# Patient Record
Sex: Female | Born: 1980
Health system: Southern US, Community
[De-identification: ages and names within clinical notes are randomized; demographics above are authoritative.]

---

## 2019-03-15 DIAGNOSIS — Z0000 Encounter for general adult medical examination without abnormal findings: Secondary | ICD-10-CM | POA: Diagnosis not present

## 2019-03-28 DIAGNOSIS — Z3169 Encounter for other general counseling and advice on procreation: Secondary | ICD-10-CM | POA: Diagnosis not present

## 2019-03-28 DIAGNOSIS — Z01419 Encounter for gynecological examination (general) (routine) without abnormal findings: Secondary | ICD-10-CM | POA: Diagnosis not present

## 2019-03-28 DIAGNOSIS — Z1151 Encounter for screening for human papillomavirus (HPV): Secondary | ICD-10-CM | POA: Diagnosis not present

## 2019-03-28 DIAGNOSIS — Z6833 Body mass index (BMI) 33.0-33.9, adult: Secondary | ICD-10-CM | POA: Diagnosis not present

## 2019-04-30 DIAGNOSIS — N970 Female infertility associated with anovulation: Secondary | ICD-10-CM | POA: Diagnosis not present

## 2019-06-12 ENCOUNTER — Other Ambulatory Visit: Payer: Self-pay

## 2019-06-12 DIAGNOSIS — Z20822 Contact with and (suspected) exposure to covid-19: Secondary | ICD-10-CM

## 2019-06-12 NOTE — Addendum Note (Signed)
Addended by: Isidoro Donning T on: 06/12/2019 02:48 PM   Modules accepted: Orders

## 2019-06-13 LAB — NOVEL CORONAVIRUS, NAA: SARS-CoV-2, NAA: NOT DETECTED

## 2019-08-07 DIAGNOSIS — N939 Abnormal uterine and vaginal bleeding, unspecified: Secondary | ICD-10-CM | POA: Diagnosis not present

## 2019-10-25 ENCOUNTER — Ambulatory Visit: Payer: BC Managed Care – PPO | Admitting: Podiatry

## 2019-10-25 ENCOUNTER — Other Ambulatory Visit: Payer: Self-pay

## 2019-10-25 DIAGNOSIS — B070 Plantar wart: Secondary | ICD-10-CM

## 2019-10-25 MED ORDER — HYDROCODONE-ACETAMINOPHEN 5-325 MG PO TABS: 1.0000 | ORAL_TABLET | Freq: Four times a day (QID) | ORAL | 0 refills | Status: DC | PRN

## 2019-10-27 NOTE — Progress Notes (Signed)
   Subjective: 39 y.o. female presenting today as a new patient with a chief complaint of throbbing pain to the plantar midfoot that began about two years ago. She is concerned for a plantar wart and states it has been gradually worsening. Walking increases the pain. She has treated the area with OTC and prescription medications. Patient is here for further evaluation and treatment.    No past medical history on file.  Objective: Physical Exam General: The patient is alert and oriented x3 in no acute distress.   Dermatology: Hyperkeratotic skin lesion(s) noted to the plantar aspect of the left foot approximately 1 cm in diameter. Pinpoint bleeding noted upon debridement. Skin is warm, dry and supple bilateral lower extremities. Negative for open lesions or macerations.   Vascular: Palpable pedal pulses bilaterally. No edema or erythema noted. Capillary refill within normal limits.   Neurological: Epicritic and protective threshold grossly intact bilaterally.    Musculoskeletal Exam: Pain on palpation to the noted skin lesion(s).  Range of motion within normal limits to all pedal and ankle joints bilateral. Muscle strength 5/5 in all groups bilateral.    Assessment: #1 plantar wart left foot   Plan of Care:  #1 Patient was evaluated. #2 Excisional debridement of the plantar wart lesion(s) was performed using a chisel blade. Cantharone was applied and the lesion(s) was dressed with a dry sterile dressing. #3 Prescription for Vicodin 5/325 mg provided to patient.  #4 patient is to return to clinic in 2 weeks.   Investment banker, corporate.   Felecia Shelling, DPM [facility]  Dr. Felecia Shelling, DPM    [address]                                        [place], [place] [address]                Office [phone]  Fax [phone]

## 2019-11-08 ENCOUNTER — Ambulatory Visit: Payer: BC Managed Care – PPO | Admitting: Podiatry

## 2019-11-08 ENCOUNTER — Other Ambulatory Visit: Payer: Self-pay

## 2019-11-08 VITALS — Temp 98.0°F

## 2019-11-08 DIAGNOSIS — B070 Plantar wart: Secondary | ICD-10-CM

## 2019-11-08 MED ORDER — CIMETIDINE 400 MG PO TABS: 400.0000 mg | ORAL_TABLET | Freq: Two times a day (BID) | ORAL | 1 refills | Status: DC

## 2019-11-12 NOTE — Progress Notes (Signed)
   Subjective: 39 y.o. female presenting today for follow up evaluation of a plantar wart of the left foot. She states the area looks worse but feels somewhat better. She has been taking Vicodin for pain. Bearing weight and walking excessively increases the symptoms. Patient is here for further evaluation and treatment.   No past medical history on file.  Objective: Physical Exam General: The patient is alert and oriented x3 in no acute distress.   Dermatology: Hyperkeratotic skin lesion(s) noted to the plantar aspect of the left foot approximately 1 cm in diameter. Pinpoint bleeding noted upon debridement. Skin is warm, dry and supple bilateral lower extremities. Negative for open lesions or macerations.   Vascular: Palpable pedal pulses bilaterally. No edema or erythema noted. Capillary refill within normal limits.   Neurological: Epicritic and protective threshold grossly intact bilaterally.    Musculoskeletal Exam: Pain on palpation to the noted skin lesion(s).  Range of motion within normal limits to all pedal and ankle joints bilateral. Muscle strength 5/5 in all groups bilateral.    Assessment: #1 plantar wart left foot   Plan of Care:  #1 Patient was evaluated. #2 Excisional debridement of the plantar wart lesion(s) was performed using a chisel blade. Salinocaine was applied and the lesion(s) was dressed with a dry sterile dressing. #3 Prescription for Cimetidine 400 mg BID #60 provided to patient.  #4 Recommended OTC wart remover daily.  #5 patient is to return to clinic in 4 weeks.   Investment banker, corporate.   Felecia Shelling, DPM [facility]  Dr. Felecia Shelling, DPM    [address]                                        [place], [place] [address]                Office [phone]  Fax [phone]

## 2019-12-10 ENCOUNTER — Ambulatory Visit: Payer: BC Managed Care – PPO | Admitting: Podiatry

## 2019-12-10 ENCOUNTER — Other Ambulatory Visit: Payer: Self-pay

## 2019-12-10 DIAGNOSIS — B070 Plantar wart: Secondary | ICD-10-CM | POA: Diagnosis not present

## 2019-12-10 MED ORDER — IMIQUIMOD 5 % EX CREA: TOPICAL_CREAM | Freq: Every day | CUTANEOUS | 3 refills | Status: DC

## 2019-12-10 NOTE — Progress Notes (Signed)
   Subjective: 39 y.o. female presenting today for follow up evaluation of a plantar wart of the left foot. Patients states there is no improvement.  She continues to experience pain and tenderness when walking.  She has been applying   the salicylic acid daily.  She presents today for follow-up treatment evaluation  No past medical history on file.  Objective: Physical Exam General: The patient is alert and oriented x3 in no acute distress.   Dermatology: Hyperkeratotic skin lesion(s) noted to the plantar aspect of the left foot, multiple. Pinpoint bleeding noted upon debridement. Skin is warm, dry and supple bilateral lower extremities. Negative for open lesions or macerations.   Vascular: Palpable pedal pulses bilaterally. No edema or erythema noted. Capillary refill within normal limits.   Neurological: Epicritic and protective threshold grossly intact bilaterally.    Musculoskeletal Exam: Pain on palpation to the noted skin lesion(s).  Range of motion within normal limits to all pedal and ankle joints bilateral. Muscle strength 5/5 in all groups bilateral.    Assessment: #1 plantar wart left foot   Plan of Care:  #1 Patient was evaluated. #2 Excisional debridement of the plantar wart lesion(s) was performed using a chisel blade. Salinocaine was applied and the lesion(s) was dressed with a dry sterile dressing. #3  Discontinue cimetidine 400 mg twice daily #4 imiquimod 5% topical cream prescribed to apply nightly at bedtime under occlusion with a 40% salicylic acid pad #5 return to clinic in 6 weeks  Investment banker, corporate.   Felecia Shelling, DPM [facility]  Dr. Felecia Shelling, DPM    [address]                                        [place], [place] [address]                Office [phone]  Fax [phone]

## 2020-01-10 ENCOUNTER — Ambulatory Visit: Payer: BC Managed Care – PPO | Admitting: Podiatry

## 2020-01-31 ENCOUNTER — Ambulatory Visit: Payer: BC Managed Care – PPO | Admitting: Podiatry

## 2020-03-28 ENCOUNTER — Encounter: Payer: Self-pay | Admitting: Emergency Medicine

## 2020-03-28 ENCOUNTER — Emergency Department
Admission: EM | Admit: 2020-03-28 | Discharge: 2020-03-28 | Disposition: A | Discharge status: Home / Self Care | Payer: BC Managed Care – PPO | Attending: Student in an Organized Health Care Education/Training Program | Admitting: Student in an Organized Health Care Education/Training Program

## 2020-03-28 ENCOUNTER — Other Ambulatory Visit: Payer: Self-pay

## 2020-03-28 ENCOUNTER — Emergency Department: Payer: BC Managed Care – PPO

## 2020-03-28 ENCOUNTER — Telehealth: Payer: Self-pay | Admitting: Emergency Medicine

## 2020-03-28 DIAGNOSIS — J189 Pneumonia, unspecified organism: Secondary | ICD-10-CM | POA: Diagnosis not present

## 2020-03-28 DIAGNOSIS — U071 COVID-19: Secondary | ICD-10-CM | POA: Diagnosis not present

## 2020-03-28 DIAGNOSIS — Z79899 Other long term (current) drug therapy: Secondary | ICD-10-CM | POA: Insufficient documentation

## 2020-03-28 DIAGNOSIS — R05 Cough: Secondary | ICD-10-CM | POA: Diagnosis not present

## 2020-03-28 DIAGNOSIS — J1282 Pneumonia due to coronavirus disease 2019: Secondary | ICD-10-CM | POA: Insufficient documentation

## 2020-03-28 DIAGNOSIS — R509 Fever, unspecified: Secondary | ICD-10-CM | POA: Diagnosis not present

## 2020-03-28 LAB — URINALYSIS, COMPLETE (UACMP) WITH MICROSCOPIC
Bilirubin Urine: NEGATIVE
Glucose, UA: NEGATIVE mg/dL
Hgb urine dipstick: NEGATIVE
Ketones, ur: 80 mg/dL — AB
Leukocytes,Ua: NEGATIVE
Nitrite: NEGATIVE
Protein, ur: 30 mg/dL — AB
Specific Gravity, Urine: 1.025 (ref 1.005–1.030)
pH: 5 (ref 5.0–8.0)

## 2020-03-28 LAB — COMPREHENSIVE METABOLIC PANEL
ALT: 49 U/L — ABNORMAL HIGH (ref 0–44)
AST: 40 U/L (ref 15–41)
Albumin: 3.6 g/dL (ref 3.5–5.0)
Alkaline Phosphatase: 59 U/L (ref 38–126)
Anion gap: 11 (ref 5–15)
BUN: 6 mg/dL (ref 6–20)
CO2: 22 mmol/L (ref 22–32)
Calcium: 8.4 mg/dL — ABNORMAL LOW (ref 8.9–10.3)
Chloride: 99 mmol/L (ref 98–111)
Creatinine, Ser: 0.68 mg/dL (ref 0.44–1.00)
GFR calc Af Amer: >60 mL/min (ref >60)
GFR calc non Af Amer: >60 mL/min (ref >60)
Glucose, Bld: 101 mg/dL — ABNORMAL HIGH (ref 70–99)
Potassium: 3.4 mmol/L — ABNORMAL LOW (ref 3.5–5.1)
Sodium: 132 mmol/L — ABNORMAL LOW (ref 135–145)
Total Bilirubin: 0.6 mg/dL (ref 0.3–1.2)
Total Protein: 6.9 g/dL (ref 6.5–8.1)

## 2020-03-28 LAB — SARS CORONAVIRUS 2 BY RT PCR (HOSPITAL ORDER, PERFORMED IN ~~LOC~~ HOSPITAL LAB): SARS Coronavirus 2: POSITIVE — AB

## 2020-03-28 LAB — LIPASE, BLOOD: Lipase: 35 U/L (ref 11–51)

## 2020-03-28 LAB — CBC
HCT: 42.2 % (ref 36.0–46.0)
Hemoglobin: 14.5 g/dL (ref 12.0–15.0)
MCH: 31.1 pg (ref 26.0–34.0)
MCHC: 34.4 g/dL (ref 30.0–36.0)
MCV: 90.6 fL (ref 80.0–100.0)
Platelets: 253 10*3/uL (ref 150–400)
RBC: 4.66 MIL/uL (ref 3.87–5.11)
RDW: 12.3 % (ref 11.5–15.5)
WBC: 5.2 10*3/uL (ref 4.0–10.5)
nRBC: 0 % (ref 0.0–0.2)

## 2020-03-28 LAB — POCT PREGNANCY, URINE: Preg Test, Ur: NEGATIVE

## 2020-03-28 LAB — POC URINE PREG, ED: Preg Test, Ur: NEGATIVE

## 2020-03-28 MED ORDER — PREDNISONE 20 MG PO TABS
60.0000 mg | ORAL_TABLET | Freq: Once | ORAL | Status: AC
  Administered 2020-03-28: 60 mg via ORAL
  Filled 2020-03-28: qty 3

## 2020-03-28 MED ORDER — PREDNISONE 20 MG PO TABS: ORAL_TABLET | ORAL | 0 refills | Status: DC

## 2020-03-28 MED ORDER — LOPERAMIDE HCL 2 MG PO CAPS
2.0000 mg | ORAL_CAPSULE | Freq: Once | ORAL | Status: AC
  Administered 2020-03-28: 2 mg via ORAL
  Filled 2020-03-28: qty 1

## 2020-03-28 MED ORDER — ALBUTEROL SULFATE HFA 108 (90 BASE) MCG/ACT IN AERS: 2.0000 | INHALATION_SPRAY | Freq: Four times a day (QID) | RESPIRATORY_TRACT | 0 refills | Status: DC | PRN

## 2020-03-28 MED ORDER — ONDANSETRON 4 MG PO TBDP: 4.0000 mg | ORAL_TABLET | Freq: Three times a day (TID) | ORAL | 0 refills | Status: DC | PRN

## 2020-03-28 MED ORDER — ONDANSETRON 4 MG PO TBDP
4.0000 mg | ORAL_TABLET | Freq: Once | ORAL | Status: AC | PRN
  Administered 2020-03-28: 4 mg via ORAL
  Filled 2020-03-28: qty 1

## 2020-03-28 MED ORDER — BENZONATATE 100 MG PO CAPS: ORAL_CAPSULE | ORAL | 0 refills | Status: DC

## 2020-03-28 NOTE — Discharge Instructions (Signed)
Your exam and CXR are consistent with COVID infection and COVID pneumonia. Take the prescription meds as directed. Take OTC Imodium-AD for diarrhea relief. Rest and hydrate to prevent dehydration. Follow-up with a local primary care provider or return to the ED as needed. Consider vaccination after your symptoms improve.

## 2020-03-28 NOTE — ED Notes (Signed)
Urine preg negative

## 2020-03-28 NOTE — ED Triage Notes (Signed)
Pt arrived via POV with reports of fever, N/V/D and cough, pt states sxs have been going on for 8 days. Has not been COVID tested.  Pt reports vomiting multiple times over night.

## 2020-03-28 NOTE — ED Notes (Signed)
Pt last had ibuprofen and tylenol around 10am this  Morning.

## 2020-03-28 NOTE — ED Provider Notes (Signed)
[facility] Emergency Department Provider Note ____________________________________________  Time seen: 1514  I have reviewed the triage vital signs and the nursing notes.  HISTORY  Chief Complaint  Fever, Cough, and Nausea  HPI Natasha Hardin is a 39 y.o. female presents herself to the ED with an 8-day complaint of Covid-like symptoms. She reports a negative home COVID test last week at onset. She describes fevers, N/V/D, and cough. She reports her son was sent home for schooling after 3 positive cases in his classroom. She has not been vaccinated against COVID-19.   History reviewed. No pertinent past medical history.  There are no problems to display for this patient.   History reviewed. No pertinent surgical history.  Prior to Admission medications   Medication Sig Start Date End Date Taking? Authorizing Provider  albuterol (VENTOLIN HFA) 108 (90 Base) MCG/ACT inhaler Inhale 2 puffs into the lungs every 6 (six) hours as needed for shortness of breath. 03/28/20   Mychele Seyller, Charlesetta Ivory, PA-C  benzonatate (TESSALON PERLES) 100 MG capsule Take 1-2 tabs TID prn cough 03/28/20   Crystal Scarberry, Charlesetta Ivory, PA-C  cimetidine (TAGAMET) 400 MG tablet Take 1 tablet (400 mg total) by mouth 2 (two) times daily. 11/08/19   Felecia Shelling, DPM  HYDROcodone-acetaminophen (NORCO/VICODIN) 5-325 MG tablet Take 1 tablet by mouth every 6 (six) hours as needed for moderate pain. 10/25/19   Felecia Shelling, DPM  imiquimod (ALDARA) 5 % cream Apply topically at bedtime. Apply under occlusion with a 40% salicylic acid pad. 12/10/19   Felecia Shelling, DPM  letrozole [facility]) 2.5 MG tablet Take 2.5 mg by mouth daily. 05/31/19   [provider]  ondansetron (ZOFRAN ODT) 4 MG disintegrating tablet Take 1 tablet (4 mg total) by mouth every 8 (eight) hours as needed. 03/28/20   Emett Stapel, Charlesetta Ivory, PA-C  predniSONE (DELTASONE) 20 MG tablet Take 3 tabs daily x 2 days; Take 2 tabs daily x 3  days; Take 1 tab daily x 3 days; Take 0.5 tabs daily x 4 days 03/28/20   Khandi Kernes, Charlesetta Ivory, PA-C    Allergies Patient has no known allergies.  History reviewed. No pertinent family history.  Social History Social History   Tobacco Use   Smoking status: Not on file  Substance Use Topics   Alcohol use: Not on file   Drug use: Not on file    Review of Systems  Constitutional: Negative for fever. Eyes: Negative for visual changes. ENT: Negative for sore throat. Cardiovascular: Negative for chest pain. Respiratory: Negative for shortness of breath. Reports cough Gastrointestinal: Negative for abdominal pain. Reports nausea, vomiting and diarrhea. Genitourinary: Negative for dysuria. Musculoskeletal: Negative for back pain. Skin: Negative for rash. Neurological: Negative for headaches, focal weakness or numbness. ____________________________________________  PHYSICAL EXAM:  VITAL SIGNS: ED Triage Vitals  Enc Vitals Group     BP 03/28/20 1337 125/90     Pulse Rate 03/28/20 1337 (!) 116     Resp 03/28/20 1337 18     Temp 03/28/20 1337 99.5 F (37.5 C)     Temp Source 03/28/20 1337 Oral     SpO2 03/28/20 1337 94 %     Weight 03/28/20 1334 215 lb (97.5 kg)     Height 03/28/20 1334 5\' 7"  (1.702 m)     Head Circumference --      Peak Flow --      Pain Score 03/28/20 1332 10     Pain Loc --  Pain Edu? --      Excl. in GC? --     Constitutional: Alert and oriented. Well appearing and in no distress. Head: Normocephalic and atraumatic. Eyes: Conjunctivae are normal. Normal extraocular movements Cardiovascular: Normal rate, regular rhythm. Normal distal pulses. Respiratory: Normal respiratory effort. No wheezes/rales/rhonchi. Gastrointestinal: Soft and nontender. No distention. Musculoskeletal: Nontender with normal range of motion in all extremities.  Neurologic:  Normal gait without ataxia. Normal speech and language. No gross focal neurologic deficits are  appreciated. Skin:  Skin is warm, dry and intact. No rash noted. Psychiatric: Mood and affect are normal. Patient exhibits appropriate insight and judgment. ____________________________________________   LABS (pertinent positives/negatives) Labs Reviewed  COMPREHENSIVE METABOLIC PANEL - Abnormal; Notable for the following components:      Result Value   Sodium 132 (*)    Potassium 3.4 (*)    Glucose, Bld 101 (*)    Calcium 8.4 (*)    ALT 49 (*)    All other components within normal limits  URINALYSIS, COMPLETE (UACMP) WITH MICROSCOPIC - Abnormal; Notable for the following components:   Color, Urine YELLOW (*)    APPearance HAZY (*)    Ketones, ur 80 (*)    Protein, ur 30 (*)    Bacteria, UA RARE (*)    All other components within normal limits  SARS CORONAVIRUS 2 BY RT PCR (HOSPITAL ORDER, PERFORMED IN [facility])  LIPASE, BLOOD  CBC  POC URINE PREG, ED  POCT PREGNANCY, URINE  ____________________________________________   RADIOLOGY  CXR   IMPRESSION: Patchy multifocal airspace opacity. Suspect atypical organism pneumonia. Advise check of COVID-19 status.  Heart size normal.  No adenopathy.  ____________________________________________  PROCEDURES  Zofran 4 mg ODT Prednisone 60 mg PO Loperamide 4 mg PO PO Challenge - apple juice  Procedures ____________________________________________  INITIAL IMPRESSION / ASSESSMENT AND PLAN / ED COURSE  Patient with ED evaluation of symptoms concerning for COVID. She was found to have symptoms consistent with infection and a CXR that revealed changes consistent with atypical viral pneumonia. She will be discharged with instructions to take the prescription meds as directed. She will select and follow-up with a PCP or return if needed.   Natasha Hardin was evaluated in Emergency Department on 03/28/2020 for the symptoms described in the history of present illness. She was evaluated in the context of the global  COVID-19 pandemic, which necessitated consideration that the patient might be at risk for infection with the SARS-CoV-2 virus that causes COVID-19. Institutional protocols and algorithms that pertain to the evaluation of patients at risk for COVID-19 are in a state of rapid change based on information released by regulatory bodies including the CDC and federal and state organizations. These policies and algorithms were followed during the patient's care in the ED.  ____________________________________________  FINAL CLINICAL IMPRESSION(S) / ED DIAGNOSES  Final diagnoses:  Pneumonia due to COVID-19 virus  COVID-19      Lissa Hoard, PA-C 03/28/20 1542    Willy Eddy, MD 03/28/20 1545

## 2020-03-30 ENCOUNTER — Encounter: Payer: Self-pay | Admitting: Nurse Practitioner

## 2020-04-07 DIAGNOSIS — K824 Cholesterolosis of gallbladder: Secondary | ICD-10-CM | POA: Diagnosis not present

## 2020-04-07 DIAGNOSIS — E039 Hypothyroidism, unspecified: Secondary | ICD-10-CM | POA: Diagnosis not present

## 2020-04-07 DIAGNOSIS — Z6834 Body mass index (BMI) 34.0-34.9, adult: Secondary | ICD-10-CM | POA: Diagnosis not present

## 2020-04-07 DIAGNOSIS — R5382 Chronic fatigue, unspecified: Secondary | ICD-10-CM | POA: Diagnosis not present

## 2020-04-09 DIAGNOSIS — K769 Liver disease, unspecified: Secondary | ICD-10-CM | POA: Diagnosis not present

## 2020-05-14 DIAGNOSIS — Z03818 Encounter for observation for suspected exposure to other biological agents ruled out: Secondary | ICD-10-CM | POA: Diagnosis not present

## 2020-05-14 DIAGNOSIS — Z1152 Encounter for screening for COVID-19: Secondary | ICD-10-CM | POA: Diagnosis not present

## 2020-07-18 NOTE — L&D Delivery Note (Signed)
Delivery Note At 1:31 PM a viable female was delivered via Vaginal, Spontaneous (Presentation:    LOA  ).  APGAR: 7, 9; weight  .   Placenta status: Spontaneous, Intact.  Cord: 3 vessels with the following complications: None.  Cord pH: n/a, good cry/ HR; good tone at . Body cord noted at delivery  Anesthesia: Epidural Episiotomy:   Lacerations: 2nd degree Suture Repair: 3.0 vicryl rapide, single figure of 8 of 4-0 vicryl needed each at subclitoral and R hymenal small splayed areas for hemostasis.  Est. Blood Loss (mL):  350  Mom to postpartum.  Baby to Couplet care / Skin to Skin.  Lendon Colonel 05/16/2021, 1:51 PM

## 2020-09-20 IMAGING — CR DG CHEST 2V
1 series · 2 of 2 positions shown · non-contrast
Comparison: None.

CLINICAL DATA: Cough and fever

EXAM:
CHEST - 2 VIEW

[Series 1: dg chest 2 view · 0.14mm/px · 2 of 2 slices shown]
[im 1/2]
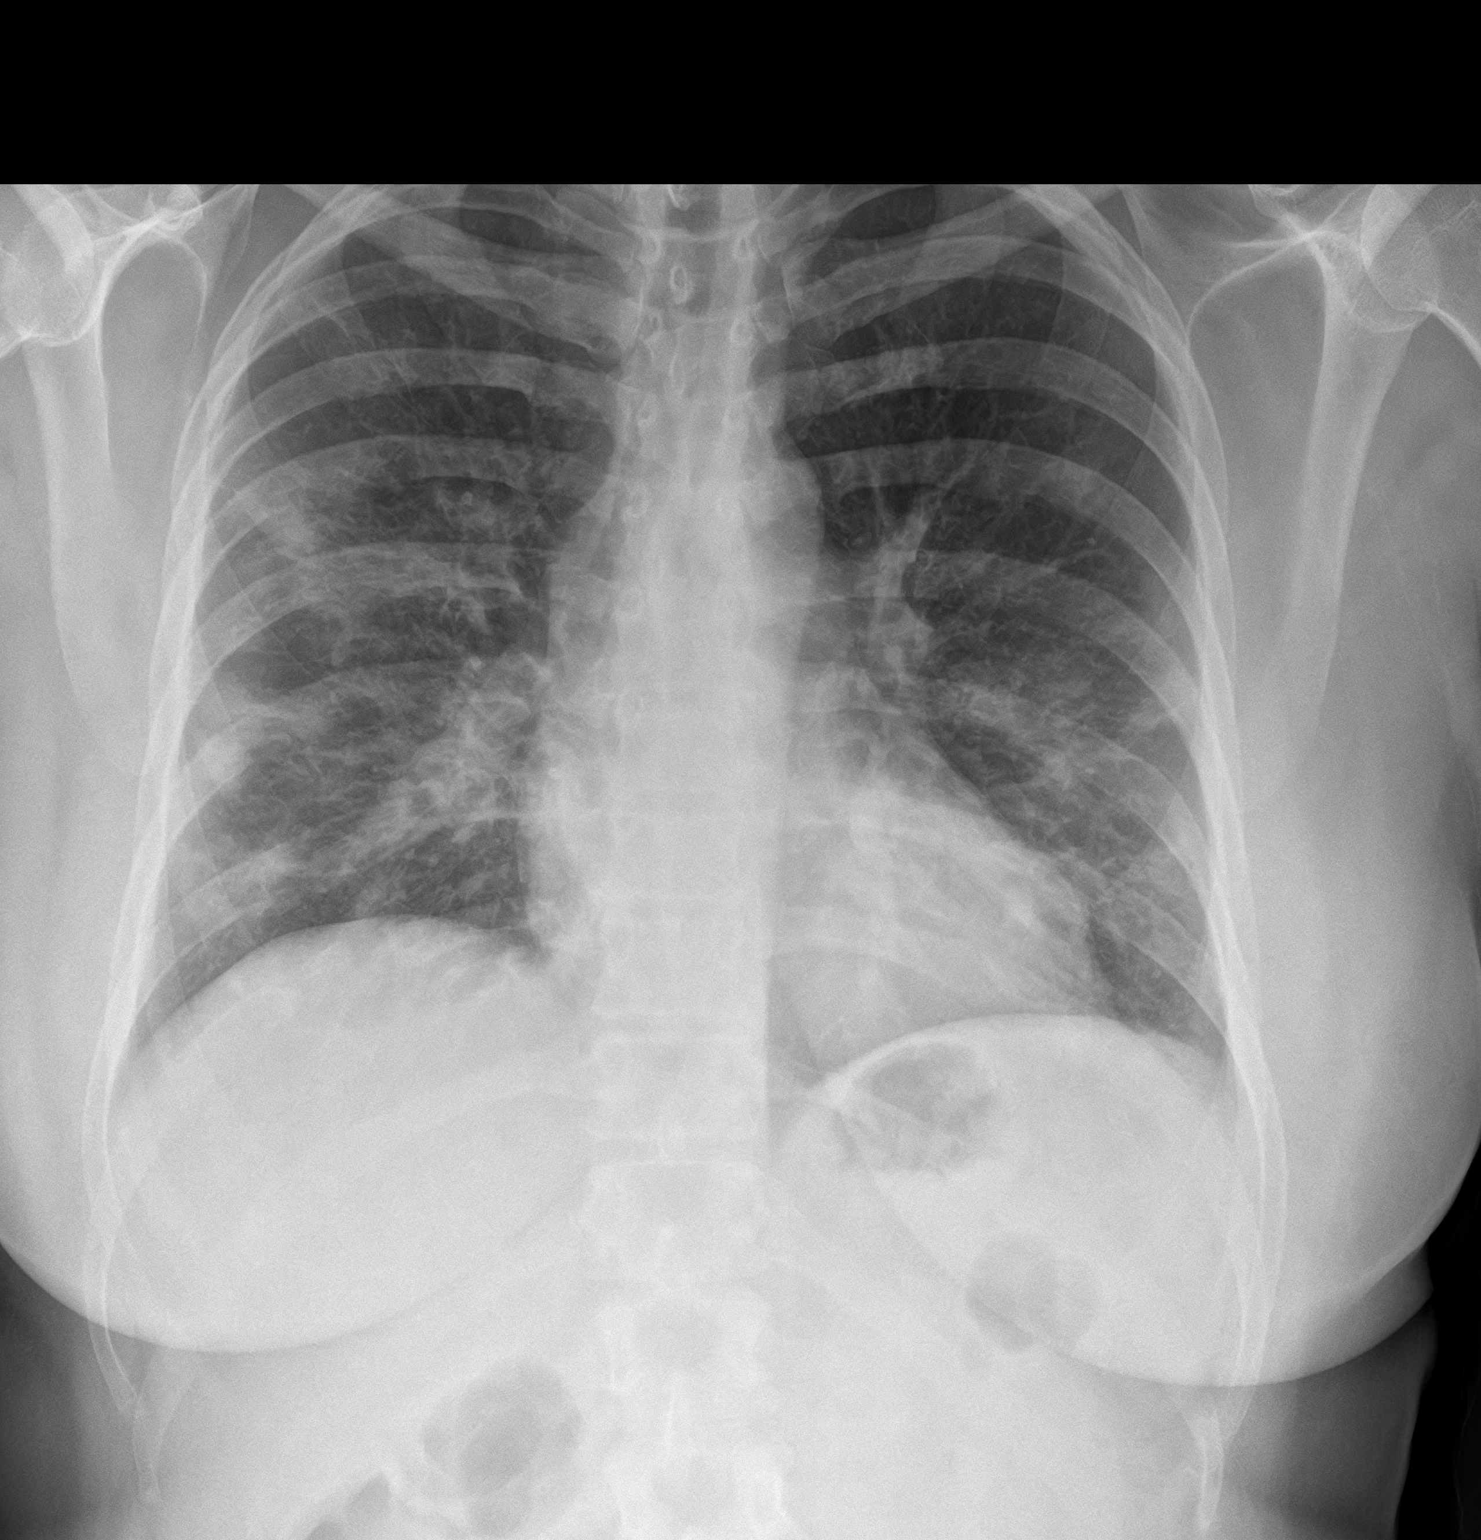
[im 2/2]
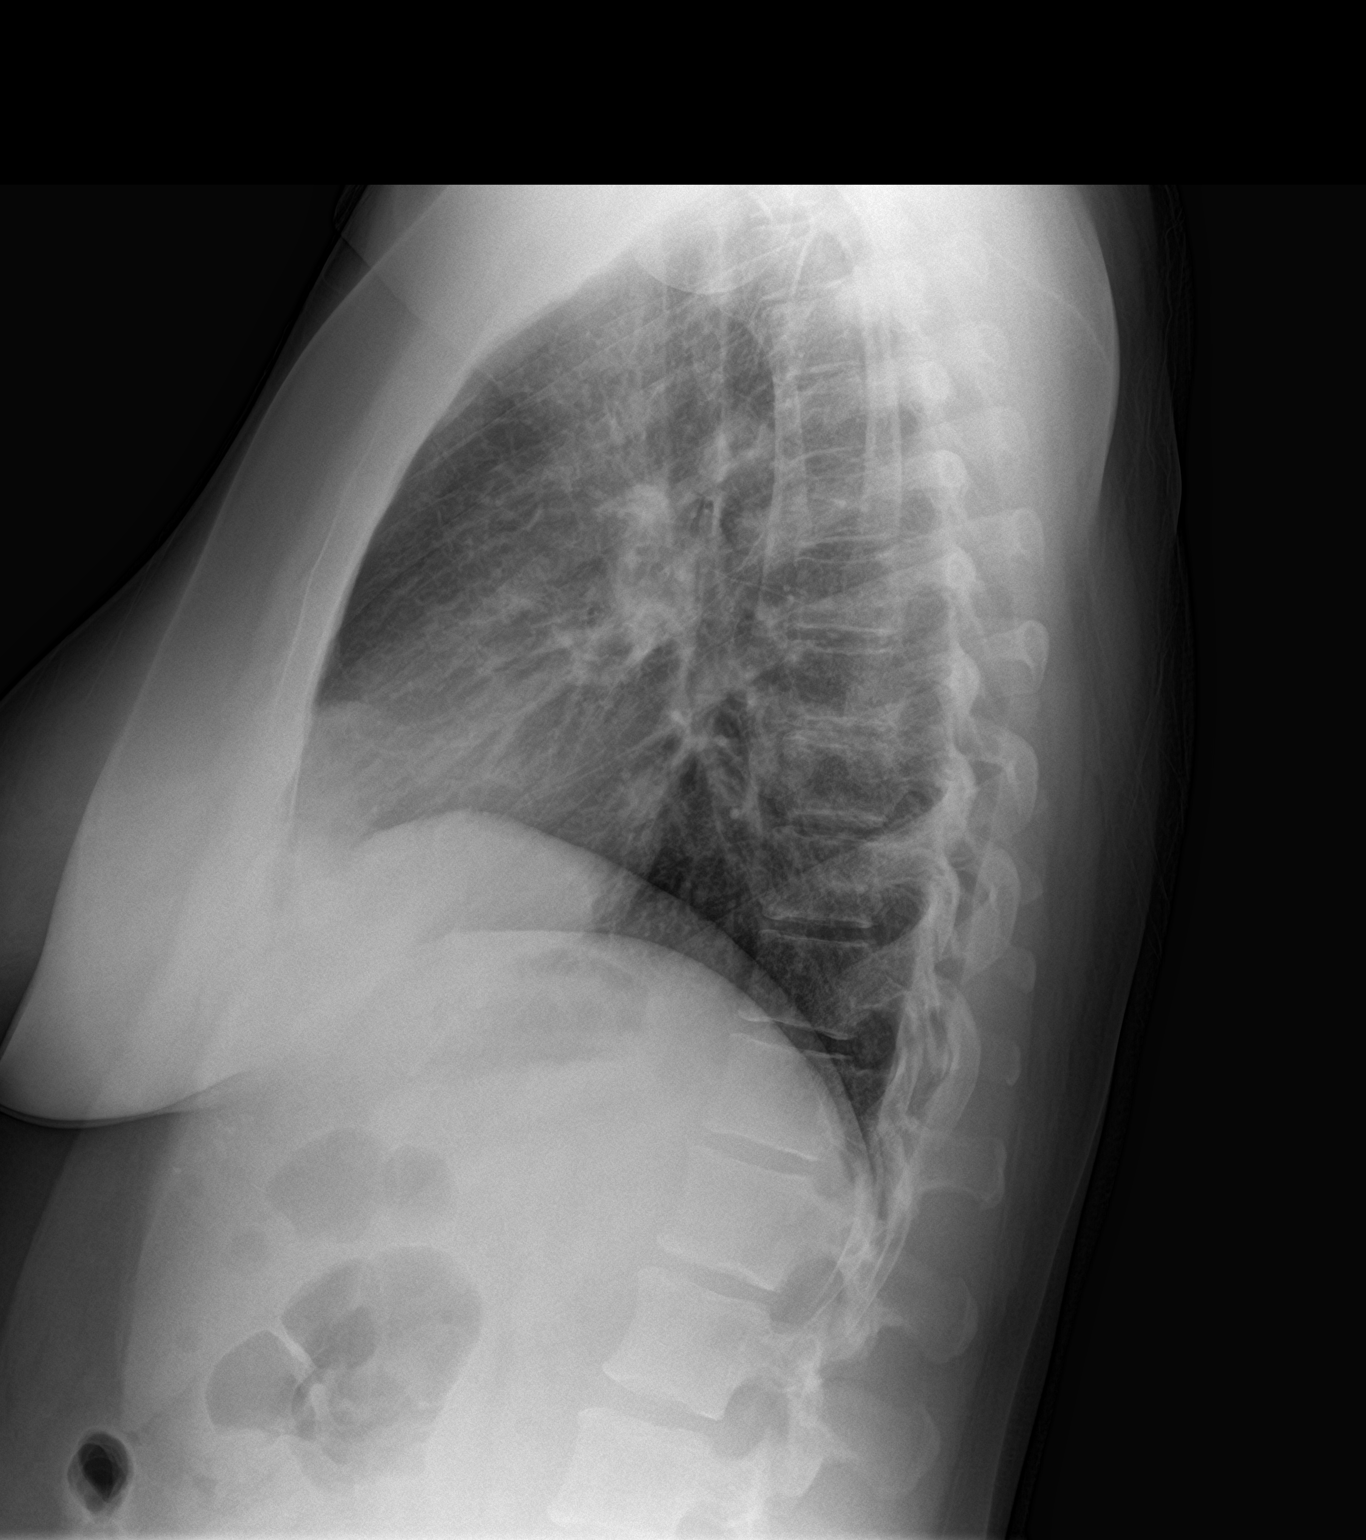

[2 of 2 positions shown; findings below may reference images not displayed]

FINDINGS: Patchy airspace opacity is noted bilaterally, primarily in the mid
and lower lung regions. Heart size and pulmonary vascularity are
normal. No adenopathy. No bone lesions.
IMPRESSION: Patchy multifocal airspace opacity. Suspect atypical organism
pneumonia. Advise check of [id number] status.

Heart size normal.  No adenopathy.

## 2020-10-28 LAB — OB RESULTS CONSOLE HEPATITIS B SURFACE ANTIGEN: Hepatitis B Surface Ag: NEGATIVE

## 2020-10-28 LAB — OB RESULTS CONSOLE RPR: RPR: NONREACTIVE

## 2020-10-28 LAB — OB RESULTS CONSOLE HIV ANTIBODY (ROUTINE TESTING): HIV: NONREACTIVE

## 2020-10-28 LAB — OB RESULTS CONSOLE RUBELLA ANTIBODY, IGM: Rubella: IMMUNE

## 2021-04-29 LAB — OB RESULTS CONSOLE GBS: GBS: NEGATIVE

## 2021-05-15 ENCOUNTER — Encounter (HOSPITAL_COMMUNITY): Payer: Self-pay | Admitting: Obstetrics

## 2021-05-15 ENCOUNTER — Inpatient Hospital Stay (HOSPITAL_COMMUNITY)
Admission: AD | Admit: 2021-05-15 | Discharge: 2021-05-18 | DRG: 806 | Disposition: A | Discharge status: Home / Self Care | Payer: BLUE CROSS/BLUE SHIELD | Attending: Obstetrics | Admitting: Obstetrics

## 2021-05-15 ENCOUNTER — Other Ambulatory Visit: Payer: Self-pay

## 2021-05-15 DIAGNOSIS — O872 Hemorrhoids in the puerperium: Secondary | ICD-10-CM | POA: Diagnosis not present

## 2021-05-15 DIAGNOSIS — Z20822 Contact with and (suspected) exposure to covid-19: Secondary | ICD-10-CM | POA: Diagnosis not present

## 2021-05-15 DIAGNOSIS — Z3A39 39 weeks gestation of pregnancy: Secondary | ICD-10-CM

## 2021-05-16 ENCOUNTER — Encounter (HOSPITAL_COMMUNITY): Payer: Self-pay | Admitting: Obstetrics

## 2021-05-16 ENCOUNTER — Inpatient Hospital Stay (HOSPITAL_COMMUNITY): Payer: BLUE CROSS/BLUE SHIELD | Admitting: Anesthesiology

## 2021-05-16 DIAGNOSIS — Z20822 Contact with and (suspected) exposure to covid-19: Secondary | ICD-10-CM | POA: Diagnosis not present

## 2021-05-16 DIAGNOSIS — O26893 Other specified pregnancy related conditions, third trimester: Secondary | ICD-10-CM | POA: Diagnosis present

## 2021-05-16 DIAGNOSIS — O872 Hemorrhoids in the puerperium: Secondary | ICD-10-CM | POA: Diagnosis not present

## 2021-05-16 DIAGNOSIS — Z3A39 39 weeks gestation of pregnancy: Secondary | ICD-10-CM | POA: Diagnosis not present

## 2021-05-16 LAB — RPR: RPR Ser Ql: NONREACTIVE

## 2021-05-16 LAB — CBC
HCT: 37.1 % (ref 36.0–46.0)
Hemoglobin: 12.4 g/dL (ref 12.0–15.0)
MCH: 31.2 pg (ref 26.0–34.0)
MCHC: 33.4 g/dL (ref 30.0–36.0)
MCV: 93.5 fL (ref 80.0–100.0)
Platelets: 270 10*3/uL (ref 150–400)
RBC: 3.97 MIL/uL (ref 3.87–5.11)
RDW: 14.3 % (ref 11.5–15.5)
WBC: 12.8 10*3/uL — ABNORMAL HIGH (ref 4.0–10.5)
nRBC: 0 % (ref 0.0–0.2)

## 2021-05-16 LAB — RESP PANEL BY RT-PCR (FLU A&B, COVID) ARPGX2
Influenza A by PCR: NEGATIVE
Influenza B by PCR: NEGATIVE
SARS Coronavirus 2 by RT PCR: NEGATIVE

## 2021-05-16 LAB — TYPE AND SCREEN
ABO/RH(D): O POS
Antibody Screen: NEGATIVE

## 2021-05-16 MED ORDER — IBUPROFEN 600 MG PO TABS
600.0000 mg | ORAL_TABLET | Freq: Four times a day (QID) | ORAL | Status: DC
  Administered 2021-05-16 – 2021-05-18 (×6): 600 mg via ORAL
  Filled 2021-05-16 (×8): qty 1

## 2021-05-16 MED ORDER — FENTANYL CITRATE (PF) 100 MCG/2ML IJ SOLN
100.0000 ug | Freq: Once | INTRAMUSCULAR | Status: AC
  Administered 2021-05-16: 100 ug via INTRAVENOUS
  Filled 2021-05-16: qty 2

## 2021-05-16 MED ORDER — TETANUS-DIPHTH-ACELL PERTUSSIS 5-2.5-18.5 LF-MCG/0.5 IM SUSY: 0.5000 mL | PREFILLED_SYRINGE | Freq: Once | INTRAMUSCULAR | Status: DC

## 2021-05-16 MED ORDER — PHENYLEPHRINE 40 MCG/ML (10ML) SYRINGE FOR IV PUSH (FOR BLOOD PRESSURE SUPPORT)
80.0000 ug | PREFILLED_SYRINGE | INTRAVENOUS | Status: DC | PRN
Start: 2021-05-16 — End: 2021-05-16

## 2021-05-16 MED ORDER — OXYCODONE HCL 5 MG PO TABS: 5.0000 mg | ORAL_TABLET | ORAL | Status: DC | PRN

## 2021-05-16 MED ORDER — SENNOSIDES-DOCUSATE SODIUM 8.6-50 MG PO TABS
2.0000 | ORAL_TABLET | ORAL | Status: DC
  Administered 2021-05-16 – 2021-05-17 (×2): 2 via ORAL
  Filled 2021-05-16 (×2): qty 2

## 2021-05-16 MED ORDER — OXYTOCIN BOLUS FROM INFUSION
333.0000 mL | Freq: Once | INTRAVENOUS | Status: AC
  Administered 2021-05-16: 14:00:00 333 mL via INTRAVENOUS

## 2021-05-16 MED ORDER — BENZOCAINE-MENTHOL 20-0.5 % EX AERO
1.0000 "application " | INHALATION_SPRAY | CUTANEOUS | Status: DC | PRN
  Filled 2021-05-16: qty 56

## 2021-05-16 MED ORDER — FLEET ENEMA 7-19 GM/118ML RE ENEM: 1.0000 | ENEMA | RECTAL | Status: DC | PRN

## 2021-05-16 MED ORDER — OXYCODONE-ACETAMINOPHEN 5-325 MG PO TABS: 1.0000 | ORAL_TABLET | ORAL | Status: DC | PRN

## 2021-05-16 MED ORDER — WITCH HAZEL-GLYCERIN EX PADS
1.0000 "application " | MEDICATED_PAD | CUTANEOUS | Status: DC | PRN
  Administered 2021-05-17: 1 via TOPICAL

## 2021-05-16 MED ORDER — FENTANYL-BUPIVACAINE-NACL 0.5-0.125-0.9 MG/250ML-% EP SOLN
12.0000 mL/h | EPIDURAL | Status: DC | PRN
Start: 2021-05-16 — End: 2021-05-16
  Administered 2021-05-16: 12 mL/h via EPIDURAL
  Filled 2021-05-16: qty 250

## 2021-05-16 MED ORDER — SIMETHICONE 80 MG PO CHEW: 80.0000 mg | CHEWABLE_TABLET | ORAL | Status: DC | PRN

## 2021-05-16 MED ORDER — ONDANSETRON HCL 4 MG/2ML IJ SOLN: 4.0000 mg | INTRAMUSCULAR | Status: DC | PRN

## 2021-05-16 MED ORDER — DIPHENHYDRAMINE HCL 50 MG/ML IJ SOLN
12.5000 mg | INTRAMUSCULAR | Status: DC | PRN
Start: 2021-05-16 — End: 2021-05-16

## 2021-05-16 MED ORDER — EPHEDRINE 5 MG/ML INJ
10.0000 mg | INTRAVENOUS | Status: DC | PRN
Start: 2021-05-16 — End: 2021-05-16

## 2021-05-16 MED ORDER — ZOLPIDEM TARTRATE 5 MG PO TABS: 5.0000 mg | ORAL_TABLET | Freq: Every evening | ORAL | Status: DC | PRN

## 2021-05-16 MED ORDER — LACTATED RINGERS IV SOLN: 500.0000 mL | INTRAVENOUS | Status: DC | PRN

## 2021-05-16 MED ORDER — COCONUT OIL OIL: 1.0000 "application " | TOPICAL_OIL | Status: DC | PRN

## 2021-05-16 MED ORDER — LIDOCAINE HCL (PF) 1 % IJ SOLN
INTRAMUSCULAR | Status: DC | PRN
  Administered 2021-05-16: 10 mL via EPIDURAL

## 2021-05-16 MED ORDER — ACETAMINOPHEN 325 MG PO TABS
650.0000 mg | ORAL_TABLET | ORAL | Status: DC | PRN
  Administered 2021-05-17: 650 mg via ORAL
  Filled 2021-05-16: qty 2

## 2021-05-16 MED ORDER — FENTANYL-BUPIVACAINE-NACL 0.5-0.125-0.9 MG/250ML-% EP SOLN: 12.0000 mL/h | EPIDURAL | Status: DC | PRN

## 2021-05-16 MED ORDER — EPHEDRINE 5 MG/ML INJ: 10.0000 mg | INTRAVENOUS | Status: DC | PRN

## 2021-05-16 MED ORDER — OXYTOCIN-SODIUM CHLORIDE 30-0.9 UT/500ML-% IV SOLN
2.5000 [IU]/h | INTRAVENOUS | Status: DC
  Administered 2021-05-16: 14:00:00 2.5 [IU]/h via INTRAVENOUS
  Filled 2021-05-16: qty 500

## 2021-05-16 MED ORDER — DIPHENHYDRAMINE HCL 50 MG/ML IJ SOLN: 12.5000 mg | INTRAMUSCULAR | Status: DC | PRN

## 2021-05-16 MED ORDER — SOD CITRATE-CITRIC ACID 500-334 MG/5ML PO SOLN
30.0000 mL | ORAL | Status: DC | PRN
  Filled 2021-05-16: qty 30

## 2021-05-16 MED ORDER — ONDANSETRON HCL 4 MG PO TABS: 4.0000 mg | ORAL_TABLET | ORAL | Status: DC | PRN

## 2021-05-16 MED ORDER — ONDANSETRON HCL 4 MG/2ML IJ SOLN
4.0000 mg | Freq: Four times a day (QID) | INTRAMUSCULAR | Status: DC | PRN
  Filled 2021-05-16: qty 2

## 2021-05-16 MED ORDER — LACTATED RINGERS IV SOLN
500.0000 mL | Freq: Once | INTRAVENOUS | Status: AC
  Administered 2021-05-16: 500 mL via INTRAVENOUS

## 2021-05-16 MED ORDER — PRENATAL MULTIVITAMIN CH
1.0000 | ORAL_TABLET | Freq: Every day | ORAL | Status: DC
  Administered 2021-05-17 – 2021-05-18 (×2): 1 via ORAL
  Filled 2021-05-16 (×2): qty 1

## 2021-05-16 MED ORDER — PHENYLEPHRINE 40 MCG/ML (10ML) SYRINGE FOR IV PUSH (FOR BLOOD PRESSURE SUPPORT): 80.0000 ug | PREFILLED_SYRINGE | INTRAVENOUS | Status: DC | PRN

## 2021-05-16 MED ORDER — LACTATED RINGERS IV SOLN: INTRAVENOUS | Status: DC

## 2021-05-16 MED ORDER — OXYCODONE-ACETAMINOPHEN 5-325 MG PO TABS: 2.0000 | ORAL_TABLET | ORAL | Status: DC | PRN

## 2021-05-16 MED ORDER — DIPHENHYDRAMINE HCL 25 MG PO CAPS: 25.0000 mg | ORAL_CAPSULE | Freq: Four times a day (QID) | ORAL | Status: DC | PRN

## 2021-05-16 MED ORDER — DIBUCAINE (PERIANAL) 1 % EX OINT: 1.0000 "application " | TOPICAL_OINTMENT | CUTANEOUS | Status: DC | PRN

## 2021-05-16 MED ORDER — ACETAMINOPHEN 325 MG PO TABS: 650.0000 mg | ORAL_TABLET | ORAL | Status: DC | PRN

## 2021-05-16 MED ORDER — LIDOCAINE HCL (PF) 1 % IJ SOLN
30.0000 mL | INTRAMUSCULAR | Status: DC | PRN
Start: 2021-05-16 — End: 2021-05-16
  Filled 2021-05-16: qty 30

## 2021-05-16 MED ORDER — OXYCODONE HCL 5 MG PO TABS: 10.0000 mg | ORAL_TABLET | ORAL | Status: DC | PRN

## 2021-05-16 NOTE — MAU Note (Signed)
Natasha Hardin is a 40 y.o. at [redacted]w[redacted]d here in MAU reporting: contractions since 0300 yesterday morning, rating them 10/10. Pt ruptured around 2300 05/15/2021. +FM 1/80/-2  Onset of complaint: 05/15/2021 Pain score: 10/10 Vitals:   05/16/21 0002  BP: 132/83  Pulse: 80  Resp: 20  Temp: (!) 97.5 F (36.4 C)  SpO2: 100%    Lab orders placed from triage: UA

## 2021-05-16 NOTE — Anesthesia Preprocedure Evaluation (Signed)
Anesthesia Evaluation  Patient identified by MRN, date of birth, ID band Patient awake    Reviewed: Allergy & Precautions, H&P , NPO status , Patient's Chart, lab work & pertinent test results, reviewed documented beta blocker date and time   Airway Mallampati: III  TM Distance: >3 FB Neck ROM: full    Dental no notable dental hx. (+) Teeth Intact, Dental Advisory Given   Pulmonary neg pulmonary ROS,    Pulmonary exam normal breath sounds clear to auscultation       Cardiovascular negative cardio ROS Normal cardiovascular exam Rhythm:regular Rate:Normal     Neuro/Psych negative neurological ROS  negative psych ROS   GI/Hepatic negative GI ROS, Neg liver ROS,   Endo/Other  negative endocrine ROS  Renal/GU negative Renal ROS  negative genitourinary   Musculoskeletal   Abdominal   Peds  Hematology negative hematology ROS (+)   Anesthesia Other Findings   Reproductive/Obstetrics (+) Pregnancy                             Anesthesia Physical Anesthesia Plan  ASA: 2  Anesthesia Plan: Epidural   Post-op Pain Management:    Induction:   PONV Risk Score and Plan: Treatment may vary due to age or medical condition  Airway Management Planned: Natural Airway  Additional Equipment: None  Intra-op Plan:   Post-operative Plan:   Informed Consent: I have reviewed the patients History and Physical, chart, labs and discussed the procedure including the risks, benefits and alternatives for the proposed anesthesia with the patient or authorized representative who has indicated his/her understanding and acceptance.     Dental Advisory Given  Plan Discussed with: Anesthesiologist  Anesthesia Plan Comments: (Labs checked- platelets confirmed with RN in room. Fetal heart tracing, per RN, reported to be stable enough for sitting procedure. Discussed epidural, and patient consents to the  procedure:  included risk of possible headache,backache, failed block, allergic reaction, and nerve injury. This patient was asked if she had any questions or concerns before the procedure started.)        Anesthesia Quick Evaluation

## 2021-05-16 NOTE — H&P (Signed)
Natasha Hardin is a 40 y.o. G1P0 at [redacted]w[redacted]d presenting for ROM and pelvic pressure. Pt notes onset contractions shortly after ROM . Good fetal movement, No vaginal bleeding, started leaking fluid around 11pm.  [facility] at [facility] since 12 wks AMA   Prenatal Transfer Tool  Maternal Diabetes: No Genetic Screening: Normal Maternal Ultrasounds/Referrals: Normal Fetal Ultrasounds or other Referrals:  None Maternal Substance Abuse:  No Significant Maternal Medications:  None Significant Maternal Lab Results: Group B Strep negative     OB History     Gravida  1   Para      Term      Preterm      AB      Living         SAB      IAB      Ectopic      Multiple      Live Births             History reviewed. No pertinent past medical history. No past surgical history on file. Family History: family history is not on file. Social History:  has no history on file for tobacco use, alcohol use, and drug use.  Review of Systems - Negative except pelvic pressure   Dilation: 6.5 Effacement (%): 80 Station: -1 Exam by:: z. burton, RN Blood pressure 118/76, pulse 82, temperature (!) 97.5 F (36.4 C), resp. rate 20, height 5\' 7"  (1.702 m), weight 103.4 kg, SpO2 98 %.  Physical Exam:  Gen: well appearing, no distress  Abd: gravid, NT, no RUQ pain LE: trace edema, equal bilaterally, non-tender Toco: q2-3 FH: baseline 120s, accelerations present, no deceleratons, 10 beat variability  Prenatal labs: ABO, Rh: --/--/O POS (10/30 0040) Antibody: NEG (10/30 0040) Rubella:   RPR:    HBsAg:    HIV:    GBS:   neg 1 hr Glucola 108  Genetic screening normal Anatomy 02-21-1992 normal   Assessment/Plan: 40 y.o. G1P0 at [redacted]w[redacted]d Active labor/ SROM. Expectant management Epidural GBS neg Reactive fetal testing   [redacted]w[redacted]d 05/16/2021, 2:37 AM

## 2021-05-16 NOTE — Lactation Note (Signed)
This note was copied from a baby's chart. Lactation Consultation Note  Patient Name: Natasha Hardin [id number] Date: 05/16/2021 Reason for consult: L&D Initial assessment Age:40 hours P1, infant STS with mother when [facility] arrived. Mother reports that she has not tried to breastfed yet. Assist mother with hand expression and observed large drops of colostrum. Mother very glad to see colostrum.   Attempt to latch infant in cradle hold infant took two sucks. Observed that infant was very blue and spitty. Paged for staff nurse Judeth Cornfield to observed infant. Infant was taken to warmer by nurse to evaluate.  Informed mother that she would have assistance with latching when she gets to her room. Encouraged frequent STS.   Maternal Data Has patient been taught Hand Expression?: Yes Does the patient have breastfeeding experience prior to this delivery?: No  Feeding Mother's Current Feeding Choice: Breast Milk  LATCH Score Latch: Repeated attempts needed to sustain latch, nipple held in mouth throughout feeding, stimulation needed to elicit sucking reflex. (only a few mins)  Audible Swallowing: None  Type of Nipple: Everted at rest and after stimulation  Comfort (Breast/Nipple): Soft / non-tender  Hold (Positioning): Full assist, staff holds infant at breast  LATCH Score: 5   Lactation Tools Discussed/Used    Interventions Interventions: Breast feeding basics reviewed;Assisted with latch;Skin to skin;Hand express  Discharge    Consult Status Consult Status: Follow-up from L&D    Michel Bickers 05/16/2021, 2:47 PM

## 2021-05-16 NOTE — Progress Notes (Signed)
S: Doing well, no complaints, pain well controlled with epidural  O: BP 123/75   Pulse 80   Temp (!) 97.5 F (36.4 C)   Resp 20   Ht 5\' 7"  (1.702 m)   Wt 103.4 kg   SpO2 99%   BMI 35.71 kg/m    FHT:  FHR: 130s bpm, variability: moderate,  accelerations:  Present,  decelerations:  Absent UC:   regular, every 2 minutes SVE:   Dilation: 6.5 Effacement (%): 80 Station: -1 Exam by:: z. burton, RN  PN lab update: RI RPR NR HIV neg Hep B neg  A / P:  40 y.o.  OB History  Gravida Para Term Preterm AB Living  1 0 0 0 0 0  SAB IAB Ectopic Multiple Live Births  0 0 0 0 0   at [redacted]w[redacted]d Spontaneous labor, progressing normally  Fetal Wellbeing:  Category I Pain Control:  Epidural  Anticipated MOD:  NSVD  [redacted]w[redacted]d 05/16/2021, 4:26 AM

## 2021-05-16 NOTE — Anesthesia Procedure Notes (Signed)
Epidural Patient location during procedure: OB Start time: 05/16/2021 1:54 AM End time: 05/16/2021 1:59 AM  Staffing Anesthesiologist: Bethena Midget, MD  Preanesthetic Checklist Completed: patient identified, IV checked, site marked, risks and benefits discussed, surgical consent, monitors and equipment checked, pre-op evaluation and timeout performed  Epidural Patient position: sitting Prep: DuraPrep and site prepped and draped Patient monitoring: continuous pulse ox and blood pressure Approach: midline Location: L4-L5 Injection technique: LOR air  Needle:  Needle type: Tuohy  Needle gauge: 17 G Needle length: 9 cm and 9 Needle insertion depth: 7 cm Catheter type: closed end flexible Catheter size: 19 Gauge Catheter at skin depth: 12 cm Test dose: negative  Assessment Events: blood not aspirated, injection not painful, no injection resistance, no paresthesia and negative IV test

## 2021-05-16 NOTE — Progress Notes (Signed)
S: Doing well, no complaints, pain well controlled with epidural  O: BP 120/73   Pulse 76   Temp 98.7 F (37.1 C) (Oral)   Resp 16   Ht 5\' 7"  (1.702 m)   Wt 103.4 kg   SpO2 95%   BMI 35.71 kg/m    FHT:  FHR: 130s bpm, variability: moderate,  accelerations:  Present,  decelerations:  Present occ sharp variable UC:   regular, every 3 minutes SVE:   Dilation: 9 Effacement (%): 90 Station: -1 Exam by:: SRussell, RN  Thin rime/ unable to push past/ 0 station  A / P:  40 y.o.  OB History  Gravida Para Term Preterm AB Living  1 0 0 0 0 0  SAB IAB Ectopic Multiple Live Births  0 0 0 0 0   at [redacted]w[redacted]d Spontaneous labor, progressing normally  Fetal Wellbeing:  Category I Pain Control:  Epidural  Anticipated MOD:  NSVD  [redacted]w[redacted]d 05/16/2021, 10:23 AM

## 2021-05-16 NOTE — Progress Notes (Signed)
During hourly rounding, walked in patient looking drained patient stated she felt ''clammy'' and "exhausted", checked her bleeding, BP and temp everything was in normal range.    Marc Morgans RN

## 2021-05-17 MED ORDER — HYDROCORT-PRAMOXINE (PERIANAL) 1-1 % EX FOAM
1.0000 | Freq: Two times a day (BID) | CUTANEOUS | Status: DC
  Administered 2021-05-17: 1 via RECTAL
  Filled 2021-05-17 (×2): qty 10

## 2021-05-17 NOTE — Anesthesia Postprocedure Evaluation (Signed)
Anesthesia Post Note  Patient: Natasha Hardin  Procedure(s) Performed: AN AD HOC LABOR EPIDURAL     Patient location during evaluation: Mother Baby Anesthesia Type: Epidural Level of consciousness: awake and alert Pain management: pain level controlled Vital Signs Assessment: post-procedure vital signs reviewed and stable Respiratory status: spontaneous breathing, nonlabored ventilation and respiratory function stable Cardiovascular status: stable Postop Assessment: no headache, no backache and epidural receding Anesthetic complications: no   No notable events documented.  Last Vitals:  Vitals:   05/17/21 0130 05/17/21 0530  BP: 106/84 111/73  Pulse: 70 67  Resp:    Temp: 36.5 C 36.8 C  SpO2: 99% 99%    Last Pain:  Vitals:   05/17/21 0530  TempSrc:   PainSc: 2    Pain Goal:                   Cathi Hazan

## 2021-05-17 NOTE — Lactation Note (Addendum)
This note was copied from a baby's chart.  NICU Lactation Consultation Note  Patient Name: Boy Jacarra Bobak [id number]'[id number] Date: 05/17/2021 Age:39 hours   Subjective Reason for consult: NICU baby; Initial assessment Mother is pumping frequently and without difficulty.   Objective Infant data: Mother's Current Feeding Choice: Breast Milk   Maternal data: G1P1001  Vaginal, Spontaneous + breast changes during pregnancy No hx breast surgery /trauma Does the patient have breastfeeding experience prior to this delivery?: No  Pumping frequency: q 2-3 hours, + drops when pumping  Assessment Infant: LATCH Score: 5  Feeding Status: NPO  Intervention/Plan Interventions: Education IDF ed Tools: Pump Ed provided on pump use, cleaning, and milk storage.  Plan: Consult Status: Follow-up  NICU Follow-up type: New admission follow up  Will plan f/u to further assist when baby is ready Mother to continue pumping q3  Elder Negus 05/17/2021, 9:59 AM

## 2021-05-17 NOTE — Progress Notes (Signed)
PPD #1, SVD, 2nd degree perineal repair, baby boy "Eli" in NICU for oxygen desaturations and hypoglycemia  S:  Reports feeling really well. Mom is about to go to NICU to visit Eli. She reports he is still on HFNC and they are r/o infection, but he is stable.              Tolerating po/ No nausea or vomiting / Denies dizziness or SOB             Bleeding is light             Pain controlled with Motrin and Tylenol             Up ad lib / ambulatory / voiding QS without difficulty   Newborn in NICU; mom pumping colostrum/ Circumcision - planned   O:               VS: BP 111/73 (BP Location: Right Arm)   Pulse 67   Temp 98.2 F (36.8 C)   Resp 18   Ht 5\' 7"  (1.702 m)   Wt 103.4 kg   SpO2 99%   Breastfeeding Unknown   BMI 35.71 kg/m  Patient Vitals for the past 24 hrs:  BP Temp Temp src Pulse Resp SpO2  05/17/21 0530 111/73 98.2 F (36.8 C) -- 67 -- 99 %  05/17/21 0130 106/84 97.7 F (36.5 C) -- 70 -- 99 %  05/16/21 2345 122/81 97.8 F (36.6 C) -- 77 -- 99 %  05/16/21 2130 124/80 98.6 F (37 C) Oral 93 18 98 %  05/16/21 1738 125/84 98.2 F (36.8 C) Oral 91 16 99 %  05/16/21 1626 128/78 98.7 F (37.1 C) Oral 82 16 100 %  05/16/21 1515 125/76 -- -- 87 16 --  05/16/21 1500 109/83 97.8 F (36.6 C) Oral 85 16 --  05/16/21 1445 130/79 -- -- 82 16 --  05/16/21 1430 122/68 -- -- 82 16 --  05/16/21 1415 124/71 -- -- 80 16 --  05/16/21 1400 124/76 98.4 F (36.9 C) Oral 80 16 --  05/16/21 1345 132/78 -- -- (!) 108 16 --  05/16/21 1220 122/62 -- -- 86 16 --      LABS:              Recent Labs    05/16/21 0029  WBC 12.8*  HGB 12.4  PLT 270               Blood type: --/--/O POS (10/30 0040)  Rubella: Immune (04/13 0000)                     I&O: Intake/Output      10/30 0701 10/31 0700 10/31 0701 11/01 0700   P.O. 250    I.V. (mL/kg) 0 (0)    Other 0    Total Intake(mL/kg) 250 (2.4)    Urine (mL/kg/hr) 1400 (0.6)    Emesis/NG output 0    Blood 350    Total Output  1750    Net -1500         Urine Occurrence 2 x    Emesis Occurrence 1 x                  Physical Exam:             Alert and oriented X3  Lungs: Clear and unlabored  Heart: regular rate and rhythm / no murmurs  Abdomen: soft, non-tender, non-distended  Fundus: firm, non-tender, U-2  Perineum: well approximated 2nd degree repair, (+) labial edema, (+) ecchymosis; external inflamed hemorrhoid, no thrombosis  Lochia: appropriate   Extremities: trace LE edema, no calf pain or tenderness    A: PPD # 1, SVD 2nd degree perineal repair - Comfort measures reviewed   External hemorrhoid - Proctofoam BID   Doing well - stable status Routine post partum orders  Lactation support PRN  Ambulation encouraged  Anticipate d/c home tomorrow   Carlean Jews, MSN, CNM [facility]

## 2021-05-18 ENCOUNTER — Ambulatory Visit: Payer: Self-pay

## 2021-05-18 NOTE — Lactation Note (Signed)
This note was copied from a baby's chart. Lactation Consultation Note  Patient Name: Natasha Hardin [id number] Date: 05/18/2021 Reason for consult: NICU baby;1st time breastfeeding;Primapara;Term;Follow-up assessment;Mother's request Age:39 hours  Visited with mom of 50 hours old FT NICU female, she's a P1 and requested a feeding assist. When LC came in the room baby was asleep and not showing any cues, mom was OK with postponing the feed at a different time, but she had some BF questions.  Educated mom about pumping expectations and how her output will vary until her milk comes in.She's anticipating a short length stay, discussed about the need to pumping after feedings at the breast just to protect her supply.   Mom got discharged today, reviewed discharge education, engorgement prevention/treatment, sore nipples, lactogenesis II, pumping schedule and supply/demand.  Plan of care:  Encouraged mom to start pumping consistently, at least 8 times/24 hours Hand expression, breast massage and edible oil (mom has her own nipple butter) were also recommended prior pumping  FOB present and supportive. All questions and concerns answered, parents to call NICU LC PRN.  Maternal Data  Mom's supply is WNL, provided reassurance about the first 3-5 days before the onset of lactogenesis II.  Feeding Mother's Current Feeding Choice: Breast Milk Nipple Type: Slow - flow  Lactation Tools Discussed/Used Tools: Pump;Flanges Flange Size: 24 Breast pump type: Double-Electric Breast Pump Pump Education: Setup, frequency, and cleaning;Milk Storage Reason for Pumping: NICU infant Pumping frequency: 4-5 times/24 hours Pumped volume: 3 mL  Interventions Interventions: Breast feeding basics reviewed;Breast compression;DEBP;Education  Discharge Pump: DEBP;Personal (Medela DEBP at home)  Consult Status Consult Status: Follow-up Date: 05/18/21 Follow-up type: In-patient   Amita Atayde Venetia Constable 05/18/2021,  4:09 PM

## 2021-05-18 NOTE — Progress Notes (Signed)
PPD #2, SVD, 2nd degree perineal repair, baby boy "Eli" in NICU for oxygen desaturations and hypoglycemia  S:  Reports feeling really well. Mom is about to go to NICU to visit Eli. She reports he is still on HFNC and they are r/o infection, but he is stable.              Tolerating po/ No nausea or vomiting / Denies dizziness or SOB             Bleeding is light             Pain controlled with Motrin and Tylenol             Up ad lib / ambulatory / voiding QS without difficulty   Newborn in NICU; mom pumping colostrum/ Circumcision - planned   O:               VS: BP 92/63 (BP Location: Right Arm)   Pulse 72   Temp 97.7 F (36.5 C) (Oral)   Resp 16   Ht 5\' 7"  (1.702 m)   Wt 103.4 kg   SpO2 97%   Breastfeeding Unknown   BMI 35.71 kg/m  Patient Vitals for the past 24 hrs:  BP Temp Temp src Pulse Resp SpO2  05/18/21 0555 92/63 97.7 F (36.5 C) Oral 72 16 97 %  05/17/21 2017 124/80 97.9 F (36.6 C) Oral 78 18 99 %  05/17/21 1433 128/80 98 F (36.7 C) Oral 90 18 99 %       LABS:              Recent Labs    05/16/21 0029  WBC 12.8*  HGB 12.4  PLT 270                Blood type: --/--/O POS (10/30 0040)  Rubella: Immune (04/13 0000)                     I&O: Intake/Output      10/31 0701 11/01 0700 11/01 0701 11/02 0700   P.O.     I.V. (mL/kg)     Other     Total Intake(mL/kg)     Urine (mL/kg/hr)     Emesis/NG output     Blood     Total Output     Net                        Physical Exam:             Alert and oriented X3  Lungs: Clear and unlabored  Heart: regular rate and rhythm / no murmurs  Abdomen: soft, non-tender, non-distended              Fundus: firm, non-tender, U-2  Perineum: well approximated 2nd degree repair, (+) labial edema, (+) ecchymosis; external inflamed hemorrhoid, no thrombosis  Lochia: appropriate   Extremities: trace LE edema, no calf pain or tenderness    A: PPD # 2, SVD 2nd degree perineal repair - Comfort measures reviewed    External hemorrhoid - Proctofoam BID   Doing well - stable status Routine post partum orders  Lactation support PRN  Ambulation encouraged  Anticipate d/c home

## 2021-05-18 NOTE — Discharge Summary (Signed)
OB Discharge Summary  Patient Name: Natasha Hardin DOB: [birth year] MRN: [id number]  Date of admission: 05/15/2021 Delivering provider: Noland Fordyce   Admitting diagnosis: Normal labor [O80, Z37.9] Intrauterine pregnancy: [redacted]w[redacted]d     Secondary diagnosis: Patient Active Problem List   Diagnosis Date Noted   SVD (spontaneous vaginal delivery) 05/17/2021   Postpartum care following vaginal delivery 10/30 05/17/2021   Second degree perineal laceration during delivery 05/17/2021   Normal labor 05/16/2021   Additional problems:na   Date of discharge: 05/18/2021   Discharge diagnosis: Principal Problem:   Postpartum care following vaginal delivery 10/30 Active Problems:   Normal labor   SVD (spontaneous vaginal delivery)   Second degree perineal laceration during delivery                                                              Post partum procedures: na  Augmentation: Pitocin Pain control: Epidural  Laceration:2nd degree  Episiotomy:  Complications: None  Hospital course:  Onset of Labor With Vaginal Delivery      40 y.o. yo G1P1001 at [redacted]w[redacted]d was admitted in Active Labor on 05/15/2021. Patient had an uncomplicated labor course as follows:  Membrane Rupture Time/Date: 11:00 PM ,05/15/2021   Delivery Method:Vaginal, Spontaneous  Episiotomy:   Lacerations:  2nd degree  Patient had an uncomplicated postpartum course.  She is ambulating, tolerating a regular diet, passing flatus, and urinating well. Patient is discharged home in stable condition on 05/18/21.  Newborn Data: Birth date:05/16/2021  Birth time:1:31 PM  Gender:Female  Living status:Living  Apgars:7 ,9  Weight:3520 g   Physical exam  Vitals:   05/17/21 0530 05/17/21 1433 05/17/21 2017 05/18/21 0555  BP: 111/73 128/80 124/80 92/63  Pulse: 67 90 78 72  Resp:  18 18 16   Temp: 98.2 F (36.8 C) 98 F (36.7 C) 97.9 F (36.6 C) 97.7 F (36.5 C)  TempSrc:  Oral Oral Oral  SpO2: 99% 99% 99% 97%  Weight:       Height:       General: alert, cooperative, and no distress Lochia: appropriate Uterine Fundus: firm Incision: Healing well with no significant drainage Perineum: repair intact, tr edema DVT Evaluation: No evidence of DVT seen on physical exam. Labs: Lab Results  Component Value Date   WBC 12.8 (H) 05/16/2021   HGB 12.4 05/16/2021   HCT 37.1 05/16/2021   MCV 93.5 05/16/2021   PLT 270 05/16/2021   CMP Latest Ref Rng & Units 03/28/2020  Glucose 70 - 99 mg/dL 05/28/2020)  BUN 6 - 20 mg/dL 6  Creatinine 500(X - 3.81 mg/dL 8.29  Sodium 9.37 - 169 mmol/L 132(L)  Potassium 3.5 - 5.1 mmol/L 3.4(L)  Chloride 98 - 111 mmol/L 99  CO2 22 - 32 mmol/L 22  Calcium 8.9 - 10.3 mg/dL 678)  Total Protein 6.5 - 8.1 g/dL 6.9  Total Bilirubin 0.3 - 1.2 mg/dL 0.6  Alkaline Phos 38 - 126 U/L 59  AST 15 - 41 U/L 40  ALT 0 - 44 U/L 49(H)   Edinburgh Postnatal Depression Scale Screening Tool 05/17/2021  I have been able to laugh and see the funny side of things. 0  I have looked forward with enjoyment to things. 0  I have blamed myself unnecessarily when things went wrong. 2  I  have been anxious or worried for no good reason. 2  I have felt scared or panicky for no good reason. 1  Things have been getting on top of me. 1  I have been so unhappy that I have had difficulty sleeping. 0  I have felt sad or miserable. 0  I have been so unhappy that I have been crying. 1  The thought of harming myself has occurred to me. 0  Edinburgh Postnatal Depression Scale Total 7   Vaccines: TDaP          done        COVID-19   na  Discharge instruction:  per After Visit Summary,  [facility] booklet and  "Understanding Mother & Baby Care" hospital booklet    Diet: routine diet  Activity: Advance as tolerated. Pelvic rest for 6 weeks.   Postpartum contraception: TBA in office  Newborn Data: Live born female  Birth Weight: 7 lb 12.2 oz (3520 g) APGAR: 7, 9  Newborn Delivery   Birth date/time:  05/16/2021 13:31:00 Delivery type: Vaginal, Spontaneous      named ELi Baby Feeding: Breast Disposition:home with mother Circumcision: pending on dc   Review the Delivery Report for details.    Follow up: [facility] 6w     Signed: Lenoard Aden, CNM, MSN 05/18/2021, 10:57 AM

## 2021-05-19 ENCOUNTER — Inpatient Hospital Stay (HOSPITAL_COMMUNITY): Admit: 2021-05-19 | Payer: Self-pay

## 2021-05-19 ENCOUNTER — Ambulatory Visit: Payer: Self-pay

## 2021-05-19 NOTE — Lactation Note (Signed)
This note was copied from a baby's chart.  NICU Lactation Consultation Note  Patient Name: Natasha Hardin SEGBT'D Date: 05/19/2021 Age:39 hours   Subjective Reason for consult: Follow-up assessment Mom continues to pump frequently. No onset of lactogenesis II at 68 hours. We discussed normalcy. She is practicing bf'ing when baby is cuing.   Objective Infant data: Mother's Current Feeding Choice: Breast Milk  Infant feeding assessment Scale for Readiness: 1 Scale for Quality: 3    Maternal data: G1P1001  Vaginal, Spontaneous  Pumping frequency: q3 drops Pumped volume: 3 mL Flange Size: 24   Pump: DEBP; Personal (Medela DEBP at home)   Assessment Infant:  Maternal: Milk volume: Normal   Intervention/Plan Interventions: Education  Tools: Pump; Flanges  Plan: Consult Status: Follow-up  NICU Follow-up type: Verify absence of engorgement; Verify onset of copious milk  Mom will request additional LC assistance today prn.   Natasha Hardin 05/19/2021, 11:34 AM

## 2021-05-20 ENCOUNTER — Ambulatory Visit: Payer: Self-pay

## 2021-05-20 NOTE — Lactation Note (Signed)
This note was copied from a baby's chart. Lactation Consultation Note  Patient Name: Boy Mahina Salatino RCVEL'F Date: 05/20/2021 Reason for consult: Follow-up assessment;Mother's request;Primapara;1st time breastfeeding;NICU baby;Term;Breastfeeding assistance Age:39 days  1130: Lactation conducted follow up appointment and provided Ms. Fenstermaker an additional white membrane for her pump upon request. We reviewed pumping recommendations; her milk is transitioning, and she is seeing better volumes.  Ms. Brinley wants to breast feed. Her main goal is to facilitate Eli's discharge, and she is okay with bottle feeding while baby is in the NICU if this is a faster route. She will tackle breast feeding more after discharge.  I recommended that I could speak with the SLP to discuss her plan.  1500 - I followed up with Ms. Frandsen. SLP suggested baby could do both breast and bottle in one setting if he shows the ability to stay alert. Ms. Shands asked to practice breast feeding. Baby had some handling in the bassinet prior to transfer and this made him upset. We transferred him, and initially he would not latch. I placed a size 24 NS on the breast. He held in in his mouth and went to sleep. I called in the RN to start supplementation process, and he spontaneously began to suckle with audible swallows noted. Ms. Machnik was pleased. RN stated that she would record total feeding time.  We noted milk transfer in the NS as well. I recommended that Ms. Westgate continue to provide opportunities for baby to practice his latch. She can supplement by bottle, and we will continue to monitor his progress and make adjustments to his plan as needed.  Maternal Data Has patient been taught Hand Expression?: Yes Does the patient have breastfeeding experience prior to this delivery?: No  Feeding Mother's Current Feeding Choice: Breast Milk and Donor Milk  LATCH Score Latch: Grasps breast easily, tongue down, lips flanged, rhythmical  sucking.  Audible Swallowing: A few with stimulation  Type of Nipple: Everted at rest and after stimulation  Comfort (Breast/Nipple): Soft / non-tender  Hold (Positioning): Assistance needed to correctly position infant at breast and maintain latch.  LATCH Score: 8   Lactation Tools Discussed/Used Tools: Nipple Shields Nipple shield size: 24;Other (comment) (may benefit from a size 20) Breast pump type: Double-Electric Breast Pump Pump Education: Setup, frequency, and cleaning Reason for Pumping: NICU  Interventions Interventions: Breast feeding basics reviewed;Assisted with latch;Skin to skin;Hand express;Adjust position;Support pillows;Education  Discharge Pump: DEBP  Consult Status Consult Status: Follow-up Date: 05/20/21 Follow-up type: In-patient    Walker Shadow 05/20/2021, 3:58 PM

## 2021-05-21 ENCOUNTER — Ambulatory Visit: Payer: Self-pay

## 2021-05-21 NOTE — Lactation Note (Signed)
This note was copied from a baby's chart.  NICU Lactation Consultation Note  Patient Name: Natasha Hardin YSAYT'K Date: 05/21/2021 Age:39 days   Subjective Reason for consult: Follow-up assessment (discharge) Infant will d/c today. Mother continues to pump and work on bf'ing. She denies s/s of engorgement today.   Baby was sleeping during visit and was recently bottle fed. Mother and I discussed use of nipple shield prn. Mother requests an outpatient LC referral.   Objective Infant data: Mother's Current Feeding Choice: Breast Milk  Infant feeding assessment Scale for Readiness: 1 Scale for Quality: 2   Maternal data: G1P1001  Vaginal, Spontaneous Does the patient have breastfeeding experience prior to this delivery?: No  Pumping frequency: q 3 hours Pumped volume: 30 mL   Pump: DEBP   Assessment Infant: LATCH Score: 10  No data recorded  Maternal: Milk volume: Normal   Intervention/Plan Interventions: Education (biological nurturing/laid back positioning)  Tools: Nipple Dorris Carnes  Plan: Consult Status: Follow-up  NICU Follow-up type: New admission follow up [facility]erral [facility] to [facility].    Elder Negus 05/21/2021, 11:05 AM

## 2021-05-31 ENCOUNTER — Telehealth (HOSPITAL_COMMUNITY): Payer: Self-pay | Admitting: *Deleted

## 2021-05-31 NOTE — Telephone Encounter (Signed)
Left message to return nurse call.  Duffy Rhody, RN 05-31-2021 at 1:55pm

## 2023-05-02 ENCOUNTER — Ambulatory Visit: Payer: 59 | Admitting: Podiatry

## 2023-05-02 DIAGNOSIS — S90852A Superficial foreign body, left foot, initial encounter: Secondary | ICD-10-CM | POA: Diagnosis not present

## 2023-05-02 NOTE — Progress Notes (Signed)
  Subjective:  Patient ID: Natasha Hardin, female    DOB: 1980/12/27,  MRN: [id number]  No chief complaint on file.   42 y.o. female presents with the above complaint.  Patient presents with left submetatarsal 5 painful lesion/stepping on a foreign body/glass.  Patient states she broke a glass container which fell on the floor and she thought she cleaned it up however left with 1 small piece of glass being inserted into her skin.  She still feels the pain denies any other acute complaints.  He wanted to get it evaluated.  Tetanus positive   Review of Systems: Negative except as noted in the HPI. Denies N/V/F/Ch.  No past medical history on file. No current outpatient medications on file.  Social History   Tobacco Use  Smoking Status Not on file  Smokeless Tobacco Not on file    No Known Allergies Objective:  There were no vitals filed for this visit. There is no height or weight on file to calculate BMI. Constitutional Well developed. Well nourished.  Vascular Dorsalis pedis pulses palpable bilaterally. Posterior tibial pulses palpable bilaterally. Capillary refill normal to all digits.  No cyanosis or clubbing noted. Pedal hair growth normal.  Neurologic Normal speech. Oriented to person, place, and time. Epicritic sensation to light touch grossly present bilaterally.  Dermatologic Left foreign body noted to left submetatarsal 5 very superficial in nature able to clinically feel it.  Pain on palpation.  No clinical signs of infection noted  Orthopedic: Normal joint ROM without pain or crepitus bilaterally. No visible deformities. No bony tenderness.   Radiographs: None Assessment:   1. Foreign body in left foot, initial encounter    Plan:  Patient was evaluated and treated and all questions answered.  Left submetatarsal 5 foreign body -All questions and concerns were discussed with the patient in extensive detail -Using chisel blade and a pickup the superficial skin and  the puncture wounds were debrided down to the level of the foreign body.  It appears to be primarily in the epidermal dermal junction.  I was able to remove the foreign body in its entirety.  Patient had complete relief of pain. -If any foot and ankle issues in future she will come back and see me.  At this time no clinical signs of infection noted  No follow-ups on file.
